# Patient Record
Sex: Male | Born: 1970 | Marital: Married | Smoking: Never smoker
Health system: Southern US, Community
[De-identification: ages and names within clinical notes are randomized; demographics above are authoritative.]

## PROBLEM LIST (undated history)

## (undated) DIAGNOSIS — Z87442 Personal history of urinary calculi: Secondary | ICD-10-CM

## (undated) DIAGNOSIS — G4730 Sleep apnea, unspecified: Secondary | ICD-10-CM

## (undated) DIAGNOSIS — L309 Dermatitis, unspecified: Secondary | ICD-10-CM

## (undated) HISTORY — DX: Personal history of urinary calculi: Z87.442

## (undated) HISTORY — DX: Dermatitis, unspecified: L30.9

## (undated) HISTORY — PX: OTHER SURGICAL HISTORY: SHX169

## (undated) HISTORY — PX: WISDOM TOOTH EXTRACTION: SHX21

---

## 2012-12-26 ENCOUNTER — Ambulatory Visit: Payer: Self-pay | Admitting: Nephrology

## 2014-06-06 ENCOUNTER — Ambulatory Visit (HOSPITAL_BASED_OUTPATIENT_CLINIC_OR_DEPARTMENT_OTHER): Payer: BC Managed Care – PPO | Attending: Internal Medicine

## 2014-06-06 ENCOUNTER — Other Ambulatory Visit: Payer: Self-pay | Admitting: Internal Medicine

## 2014-06-06 DIAGNOSIS — G4733 Obstructive sleep apnea (adult) (pediatric): Secondary | ICD-10-CM | POA: Diagnosis not present

## 2014-06-06 DIAGNOSIS — R0683 Snoring: Secondary | ICD-10-CM | POA: Diagnosis not present

## 2014-06-06 DIAGNOSIS — G4710 Hypersomnia, unspecified: Secondary | ICD-10-CM | POA: Insufficient documentation

## 2014-06-06 DIAGNOSIS — Z9989 Dependence on other enabling machines and devices: Secondary | ICD-10-CM

## 2014-06-06 DIAGNOSIS — Z6829 Body mass index (BMI) 29.0-29.9, adult: Secondary | ICD-10-CM | POA: Insufficient documentation

## 2014-06-14 DIAGNOSIS — G4733 Obstructive sleep apnea (adult) (pediatric): Secondary | ICD-10-CM

## 2014-06-14 NOTE — Sleep Study (Signed)
[facility]  NAME: Peter Price DATE OF BIRTH:  07/06/1971 MEDICAL RECORD NUMBER [id number]  LOCATION: [facility]  PHYSICIAN: Coralyn HellingVineet Natashia Roseman, M.D. DATE OF STUDY: 06/06/2014  SLEEP STUDY TYPE: Split night protocol               REFERRING PHYSICIAN: Stephanie AcreMungal, Vishal, MD  INDICATION FOR STUDY:  Peter Price is a 43 y.o. male who presents to the sleep lab for evaluation of hypersomnia with obstructive sleep apnea.  He reports snoring, sleep disruption, apnea, and daytime sleepiness.  EPWORTH SLEEPINESS SCORE: 4. HEIGHT: 5\' 7"  WEIGHT: 184 lbs   BMI: 29  NECK SIZE: 15 in.  MEDICATIONS:  No current outpatient prescriptions on file prior to visit.   No current facility-administered medications on file prior to visit.    SLEEP ARCHITECTURE:  Diagnostic portion: Total recording time: 145 minutes.  Total sleep time was: 121 minutes.  Sleep efficiency: 83.4%.  Sleep latency: 10 minutes.  REM latency: 107 minutes.  Stage N1: 6.6%.  Stage N2: 81.8%.  Stage N3: 0%.  Stage R:  11.6%.  Supine sleep: 62 minutes.  Non-supine sleep: 59 minutes.  Titration portion: Total recording time: 224 minutes.  Total sleep time was: 174.5 minutes.  Sleep efficiency: 77.9%.  Sleep latency: 0 minutes.  REM latency: 70 minutes.  Stage N1: 6.9%.  Stage N2: 57.6%.  Stage N3: 0%.  Stage R:  35.5%.  Supine sleep: 110.5 minutes.  Non-supine sleep: 64 minutes.  CARDIAC DATA:  Average heart rate: 81 beats per minute. Rhythm strip: sinus rhythm.  RESPIRATORY DATA: Average respiratory rate: 16. Snoring: loud.  Diagnostic portion: Average AHI: 54.   Apnea index: 12.4.  Hypopnea index: 41.7. Obstructive apnea index: 11.9.  Central apnea index: 0.5.  Mixed apnea index: 0. REM AHI: 55.7.  NREM AHI: 53.8. Supine AHI: 75.5. Non-supine AHI: 31.5.  Titration portion: He was started on CPAP 5 and increased to 8 cm H2O.  With CPAP at 6 cm H2O his AHI was reduced to 0.6.  At  this pressure setting he was observed in REM and supine sleep.  MOVEMENT/PARASOMNIA:  Diagnostic portion: Periodic limb movement: 0.  Period limb movements with arousals: 0.  Titration portion: Periodic limb movement: 0.  Period limb movements with arousals: 0.  Restroom trips: one.  OXYGEN DATA:  Baseline oxygenation: 93%. Lowest SaO2: 81%. Time spent below SaO2 90%: 14 minutes. Supplemental oxygen used: none.  IMPRESSION/ RECOMMENDATION:   This study showed severe obstructive sleep apnea with an AHI of 54 and SaO2 low of 81%.  He did very well with CPAP 6 cm H2O.  He was fitted with a small size Fisher Paykel Eson mask.    Coralyn HellingVineet Telesforo Brosnahan, M.D. Diplomate, Biomedical engineerAmerican Board of Sleep Medicine  ELECTRONICALLY SIGNED ON:  06/14/2014, 2:33 PM [facility] PH: [phone]   FX: [phone] ACCREDITED BY THE AMERICAN ACADEMY OF SLEEP MEDICINE

## 2014-06-15 ENCOUNTER — Telehealth: Payer: Self-pay | Admitting: Internal Medicine

## 2014-06-15 DIAGNOSIS — G4733 Obstructive sleep apnea (adult) (pediatric): Secondary | ICD-10-CM

## 2014-06-15 NOTE — Telephone Encounter (Signed)
Per Dr. Dema SeverinMungal, please place order for cpap. [facility] rep [facility] and [facility]. Order printed and signed by Dr. Dema SeverinMungal and left in [facility] office with Sleep Study results with Dr. Courtney ParisMungal's nurse. Nothing further needed at this time.

## 2014-06-19 ENCOUNTER — Encounter: Payer: Self-pay | Admitting: Internal Medicine

## 2014-06-19 ENCOUNTER — Ambulatory Visit (INDEPENDENT_AMBULATORY_CARE_PROVIDER_SITE_OTHER): Payer: BC Managed Care – PPO | Admitting: Internal Medicine

## 2014-06-19 VITALS — BP 140/90 | HR 101 | Temp 97.1°F | Ht 67.0 in | Wt 191.6 lb

## 2014-06-19 DIAGNOSIS — Z91048 Other nonmedicinal substance allergy status: Secondary | ICD-10-CM

## 2014-06-19 DIAGNOSIS — Z9109 Other allergy status, other than to drugs and biological substances: Secondary | ICD-10-CM

## 2014-06-19 DIAGNOSIS — G4733 Obstructive sleep apnea (adult) (pediatric): Secondary | ICD-10-CM

## 2014-06-19 DIAGNOSIS — R05 Cough: Secondary | ICD-10-CM

## 2014-06-19 DIAGNOSIS — R059 Cough, unspecified: Secondary | ICD-10-CM | POA: Insufficient documentation

## 2014-06-19 MED ORDER — ALBUTEROL SULFATE HFA 108 (90 BASE) MCG/ACT IN AERS: 2.0000 | INHALATION_SPRAY | RESPIRATORY_TRACT | Status: DC | PRN

## 2014-06-19 MED ORDER — OMEPRAZOLE 40 MG PO CPDR
40.0000 mg | DELAYED_RELEASE_CAPSULE | Freq: Every day | ORAL | Status: DC
Start: 2014-06-19 — End: 2019-03-17

## 2014-06-19 NOTE — Assessment & Plan Note (Signed)
Severe obstructive sleep apnea.   1. OSA  Discussed sleep data and reviewed with patient.  Encouraged proper weight management.  Excessive weight may contribute to snoring.  Monitor sedative use.  Discussed driving precautions and its relationship with hypersomnolence.  Discussed operating dangerous equipment and its relationship with hypersomnolence.  Discussed sleep hygiene, and benefits of a fixed sleep waked time.  The importance of getting eight or more hours of sleep discussed with patient.  Discussed limiting the use of the computer and television before bedtime.  Decrease naps during the day, so night time sleep will become enhanced.  Limit caffeine, and sleep deprivation.  HTN, stroke, and heart failure are potential risk factors.      Plan: Follow up in  month for C-PAP compliance, unless other concerns, issues, or needs develop, then call our office for an appointment.  - referral to DME for autopap 5-15, will followup on download.

## 2014-06-19 NOTE — Assessment & Plan Note (Signed)
Multifactorial: Possible seasonal allergies, reflux, asthma (possible), OSA.  Plan: -Give a history and current physical examination, it seems to be an environmental component to this cough that could be causing bronchospasms. However, there also could be a component of silent reflux that is also exacerbating the symptoms. -Will give a trial of omeprazole 40 mg daily for one month. -Optimizing OSA treatment is also paramount for any cough/bronchospasms, continue with CPAP. -Further evaluation with pulmonary function testing and 6 minute walk test. -Will also give trial of bronchodilator, albuterol 2 puffs every 4 hours when necessary shortness of breath, wheezing, cough. -if above interventions did not provide the necessary relief will further consider nasal steroid at next visit.

## 2014-06-19 NOTE — Assessment & Plan Note (Signed)
Exacerbated by cold weather and seasonal changes, especially fall to winter and winter to spring. Plan: -As outlined and cough management. -Patient may require an inhaled corticosteroid during season changes only, will continue to monitor closely for now.

## 2014-06-19 NOTE — Patient Instructions (Signed)
We will schedule you for 1 month f/u after breathing test. We will schedule a breathing test. Please check pharmacy for prescriptions

## 2014-06-19 NOTE — Progress Notes (Signed)
Date: 06/19/2014  MRN# [id number] Peter Price 09/13/1970  Referring Physician: self  Peter Price is a 43 y.o. old male seen in consultation for sleep apnea   CC: "sleep apnea" Chief Complaint  Patient presents with  . Advice Only    New Sleep Consult    HPI:  Patient is a pleasant 43 year old male with a past medical history significant only for renal calculi in the left kidney, who self-referred today for a followup of a split sleep study. Patient states that he's had complaints of loud snoring and possible apneic events in the past and decided to have a home sleep study performed. Home sleep study performed on 05/28/2014 that showed significant sleep apnea with AHI of 53.5, lowest saturation 77%. At this point the patient was referred for a split sleep study with [facility] [place]. Patient also stated that he experiences mild cough and shortness of breath with seasonal changes, especially with cold weather; in the past he has tried a sample of Symbicort relief in one to 2 days.   Obstructive Sleep Apnea Screening The patient was screened with the STOP-BANG questionnaire. >3 positive responses is considered a positive screen  Snoring  YES Tiredness  YES Observed Apnea YES Pressure (HTN) NO BMI >35  30 Age > 50  NO Neck >17"  NO Gender (male) YES  Total: 4/8  Screen: POSITIVE     PMHX:   Past Medical History  Diagnosis Date  . H/O renal calculi     Left kidney  . Eczema    Surgical Hx:  Past Surgical History  Procedure Laterality Date  . None     Family Hx:  Family History  Problem Relation Age of Onset  . Asthma Cousin    Social Hx:   History  Substance Use Topics  . Smoking status: Not on file  . Smokeless tobacco: Not on file  . Alcohol Use: Not on file   Medication:   Current Outpatient Rx  Name  Route  Sig  Dispense  Refill  . albuterol (PROVENTIL HFA;VENTOLIN HFA) 108 (90 BASE) MCG/ACT inhaler   Inhalation   Inhale 2 puffs into the  lungs every 2 (two) hours as needed for wheezing or shortness of breath.   1 Inhaler   2   . omeprazole (PRILOSEC) 40 MG capsule   Oral   Take 1 capsule (40 mg total) by mouth daily.   30 capsule   1       Allergies:  Review of patient's allergies indicates no known allergies.  Review of Systems: Gen:  Denies  fever, sweats, chills HEENT: Denies blurred vision, double vision, ear pain, eye pain, hearing loss, nose bleeds, sore throat Cvc:  No dizziness, chest pain or heaviness Resp:   Denies cough or sputum porduction, shortness of breath Gi: Denies swallowing difficulty, stomach pain, nausea or vomiting, diarrhea, constipation, bowel incontinence Gu:  Denies bladder incontinence, burning urine Ext:   No Joint pain, stiffness or swelling Skin: No skin rash, easy bruising or bleeding or hives Endoc:  No polyuria, polydipsia , polyphagia or weight change Psych: No depression, insomnia or hallucinations  Other:  All other systems negative  Physical Examination:   VS: BP 140/90 mmHg  Pulse 101  Temp(Src) 97.1 F (36.2 C) (Oral)  Ht 5\' 7"  (1.702 m)  Wt 191 lb 9.6 oz (86.909 kg)  BMI 30.00 kg/m2  SpO2 96%  General Appearance: No distress  Neuro:without focal findings, mental status, speech normal, alert and oriented, cranial  nerves 2-12 intact, reflexes normal and symmetric, sensation grossly normal  HEENT: PERRLA, EOM intact, no ptosis, no other lesions noticed; Mallampati 2 Pulmonary: normal breath sounds., diaphragmatic excursion normal.No wheezing, No rales;   Sputum Production:  none CardiovascularNormal S1,S2.  No m/r/g.  Abdominal aorta pulsation normal.    Abdomen: Benign, Soft, non-tender, No masses, hepatosplenomegaly, No lymphadenopathy Renal:  No costovertebral tenderness  GU:  No performed at this time. Endoc: No evident thyromegaly, no signs of acromegaly or Cushing features Skin:   warm, eczema patch on anterior right leg  Extremities: normal, no cyanosis,  clubbing, no edema, warm with normal capillary refill. Other findings:none   Split Night Study results 06/06/14 Diagnostic portion: Average AHI: 54.  Apnea index: 12.4. Hypopnea index: 41.7. Obstructive apnea index: 11.9. Central apnea index: 0.5. Mixed apnea index: 0. REM AHI: 55.7. NREM AHI: 53.8. Supine AHI: 75.5. Non-supine AHI: 31.5.  Titration portion: He was started on CPAP 5 and increased to 8 cm H2O. With CPAP at 6 cm H2O his AHI was reduced to 0.6. At this pressure setting he was observed in REM and supine sleep.  Assessment and Plan: OSA (obstructive sleep apnea) Severe obstructive sleep apnea.   1. OSA  Discussed sleep data and reviewed with patient.  Encouraged proper weight management.  Excessive weight may contribute to snoring.  Monitor sedative use.  Discussed driving precautions and its relationship with hypersomnolence.  Discussed operating dangerous equipment and its relationship with hypersomnolence.  Discussed sleep hygiene, and benefits of a fixed sleep waked time.  The importance of getting eight or more hours of sleep discussed with patient.  Discussed limiting the use of the computer and television before bedtime.  Decrease naps during the day, so night time sleep will become enhanced.  Limit caffeine, and sleep deprivation.  HTN, stroke, and heart failure are potential risk factors.      Plan: Follow up in  month for C-PAP compliance, unless other concerns, issues, or needs develop, then call our office for an appointment.  - referral to DME for autopap 5-15, will followup on download.      Cough Multifactorial: Possible seasonal allergies, reflux, asthma (possible), OSA.  Plan: -Give a history and current physical examination, it seems to be an environmental component to this cough that could be causing bronchospasms. However, there also could be a component of silent reflux that is also exacerbating the symptoms. -Will give a trial of  omeprazole 40 mg daily for one month. -Optimizing OSA treatment is also paramount for any cough/bronchospasms, continue with CPAP. -Further evaluation with pulmonary function testing and 6 minute walk test. -Will also give trial of bronchodilator, albuterol 2 puffs every 4 hours when necessary shortness of breath, wheezing, cough. -if above interventions did not provide the necessary relief will further consider nasal steroid at next visit.  Environmental allergies Exacerbated by cold weather and seasonal changes, especially fall to winter and winter to spring. Plan: -As outlined and cough management. -Patient may require an inhaled corticosteroid during season changes only, will continue to monitor closely for now.     Updated Medication List Outpatient Encounter Prescriptions as of 06/19/2014  Medication Sig  . albuterol (PROVENTIL HFA;VENTOLIN HFA) 108 (90 BASE) MCG/ACT inhaler Inhale 2 puffs into the lungs every 2 (two) hours as needed for wheezing or shortness of breath.  Marland Kitchen omeprazole (PRILOSEC) 40 MG capsule Take 1 capsule (40 mg total) by mouth daily.  . [DISCONTINUED] albuterol (PROVENTIL HFA;VENTOLIN HFA) 108 (90 BASE) MCG/ACT inhaler Inhale  2 puffs into the lungs every 2 (two) hours as needed for wheezing or shortness of breath.    Orders for this visit: No orders of the defined types were placed in this encounter.     Thank  you for the consultation and for allowing [facility] to assist in the care of your patient. Our recommendations are noted above.  Please contact us if we can be of further service.   Stephanie AcreVishal Brayln Duque, MD [facility] Office Number: [phone]

## 2014-06-20 ENCOUNTER — Institutional Professional Consult (permissible substitution): Payer: BC Managed Care – PPO | Admitting: Internal Medicine

## 2014-06-21 ENCOUNTER — Institutional Professional Consult (permissible substitution): Payer: Self-pay | Admitting: Internal Medicine

## 2014-07-02 ENCOUNTER — Telehealth: Payer: Self-pay | Admitting: Internal Medicine

## 2014-07-02 ENCOUNTER — Encounter: Payer: Self-pay | Admitting: *Deleted

## 2014-07-02 NOTE — Telephone Encounter (Signed)
Called and left message that letter for cpap therapy has been written and will be available for pick-up today.

## 2014-07-30 ENCOUNTER — Other Ambulatory Visit: Payer: Self-pay | Admitting: Internal Medicine

## 2014-07-30 DIAGNOSIS — R059 Cough, unspecified: Secondary | ICD-10-CM

## 2014-07-30 DIAGNOSIS — R05 Cough: Secondary | ICD-10-CM

## 2014-08-07 ENCOUNTER — Ambulatory Visit (INDEPENDENT_AMBULATORY_CARE_PROVIDER_SITE_OTHER): Payer: BLUE CROSS/BLUE SHIELD | Admitting: Internal Medicine

## 2014-08-07 ENCOUNTER — Encounter: Payer: Self-pay | Admitting: Internal Medicine

## 2014-08-07 VITALS — BP 122/84 | HR 86 | Temp 98.1°F | Wt 193.6 lb

## 2014-08-07 DIAGNOSIS — R05 Cough: Secondary | ICD-10-CM

## 2014-08-07 DIAGNOSIS — J4520 Mild intermittent asthma, uncomplicated: Secondary | ICD-10-CM | POA: Diagnosis not present

## 2014-08-07 DIAGNOSIS — Z91048 Other nonmedicinal substance allergy status: Secondary | ICD-10-CM | POA: Diagnosis not present

## 2014-08-07 DIAGNOSIS — J45909 Unspecified asthma, uncomplicated: Secondary | ICD-10-CM | POA: Insufficient documentation

## 2014-08-07 DIAGNOSIS — G4733 Obstructive sleep apnea (adult) (pediatric): Secondary | ICD-10-CM | POA: Diagnosis not present

## 2014-08-07 DIAGNOSIS — R059 Cough, unspecified: Secondary | ICD-10-CM

## 2014-08-07 DIAGNOSIS — Z9109 Other allergy status, other than to drugs and biological substances: Secondary | ICD-10-CM

## 2014-08-07 LAB — PULMONARY FUNCTION TEST
DL/VA % pred: 123 %
DL/VA: 5.52 ml/min/mmHg/L
DLCO UNC: 26.28 ml/min/mmHg
DLCO unc % pred: 92 %
FEF 25-75 POST: 3.95 L/s
FEF 25-75 Pre: 4.25 L/sec
FEF2575-%CHANGE-POST: -7 %
FEF2575-%PRED-POST: 110 %
FEF2575-%PRED-PRE: 119 %
FEV1-%CHANGE-POST: -1 %
FEV1-%Pred-Post: 82 %
FEV1-%Pred-Pre: 83 %
FEV1-Post: 3.09 L
FEV1-Pre: 3.13 L
FEV1FVC-%Change-Post: 3 %
FEV1FVC-%PRED-PRE: 108 %
FEV6-%Change-Post: -4 %
FEV6-%PRED-POST: 76 %
FEV6-%Pred-Pre: 79 %
FEV6-PRE: 3.67 L
FEV6-Post: 3.51 L
FEV6FVC-%PRED-PRE: 103 %
FEV6FVC-%Pred-Post: 103 %
FVC-%Change-Post: -4 %
FVC-%PRED-POST: 74 %
FVC-%Pred-Pre: 77 %
FVC-PRE: 3.67 L
FVC-Post: 3.51 L
Post FEV1/FVC ratio: 88 %
Post FEV6/FVC ratio: 100 %
Pre FEV1/FVC ratio: 85 %
Pre FEV6/FVC Ratio: 100 %
RV % pred: 71 %
RV: 1.22 L
TLC % pred: 78 %
TLC: 4.94 L

## 2014-08-07 MED ORDER — FLUTICASONE FUROATE 200 MCG/ACT IN AEPB: 200.0000 ug | INHALATION_SPRAY | Freq: Every day | RESPIRATORY_TRACT | Status: DC

## 2014-08-07 NOTE — Progress Notes (Signed)
PFT performed today. 

## 2014-08-07 NOTE — Assessment & Plan Note (Signed)
Severe obstructive sleep apnea.   1. OSA  Discussed sleep data and reviewed with patient.  Encouraged proper weight management.  Excessive weight may contribute to snoring.  Monitor sedative use.  Discussed driving precautions and its relationship with hypersomnolence.  Discussed operating dangerous equipment and its relationship with hypersomnolence.  Discussed sleep hygiene, and benefits of a fixed sleep waked time.  The importance of getting eight or more hours of sleep discussed with patient.  Discussed limiting the use of the computer and television before bedtime.  Decrease naps during the day, so night time sleep will become enhanced.  Limit caffeine, and sleep deprivation.  HTN, stroke, and heart failure are potential risk factors.      Plan: Follow up in  4-5 months for C-PAP compliance, unless other concerns, issues, or needs develop, then call our office for an appointment.  - autopap 5-15, will followup on download.

## 2014-08-07 NOTE — Assessment & Plan Note (Signed)
Patient now with improvement in cough\wheeze\ and sob with the use of cpap. However, further history reveals the following: - sob\wheezig with weather changes (Spring, Fall to winter, and pollen\dust)  I believe that the patient has environmental\extrinsic causes for his symptoms. We will plan for the following:  Plan - allergy control during weather\season (OTC antihistamine) along with Qvar 40mcg or Arnuity 100mcg (1 puff daily) - cont with cpap use - avoid allergens

## 2014-08-07 NOTE — Assessment & Plan Note (Signed)
Exacerbated by cold weather and seasonal changes, especially fall to winter and winter to spring. Plan: - see plan for extrinsic asthma

## 2014-08-07 NOTE — Patient Instructions (Addendum)
Follow up with Dr. Dema SeverinMungal in 4-5 months  Obstructive Sleep Apnea - use cpap night for minimum 4 hours per night - weight loss, healthy diet and exercise - please bring a copy of your compliance report from DME or have DME fax a copy to [facility] prior to your next visit.   Extrinsic Asthma - due to allergens, mostly a seasonal symptomology - avoid allergens - may use OTC antihistamines when seasons are about to change or with rapid weather changes - may use ICS (Qvar or Arnuity) starting 2 weeks before season changes and for 1 month after season changes (call office for rx if needed).

## 2014-08-07 NOTE — Progress Notes (Signed)
MRN# [id number] Peter Price 12-19-1970   CC: Chief Complaint  Patient presents with  . Follow-up    follow up pft      Brief History: HPI 06/19/14 Patient is a pleasant 44 year old male with a past medical history significant only for renal calculi in the left kidney, who self-referred today for a followup of a split sleep study. Patient states that he's had complaints of loud snoring and possible apneic events in the past and decided to have a home sleep study performed. Home sleep study performed on 05/28/2014 that showed significant sleep apnea with AHI of 53.5, lowest saturation 77%. At this point the patient was referred for a split sleep study with [facility] [place]. Patient also stated that he experiences mild cough and shortness of breath with seasonal changes, especially with cold weather; in the past he has tried a sample of Symbicort relief in one to 2 days.  A:severe sleep apnea, ? Extrinsic asthma Plan - autopap 5-15, compliance education, pfts and 6 mwt    Events since last clinic visit: Patient present today for a follow up of his OSA, along with discussion of his pfts and .  Patient states that since using his cpap machine he no longer has a cough, no wheezing or acid reflux type symptoms. Patient noted sob\wheezing mostly with change in season (Winter, Spring) and with allergens (pollen and dust); in the past he tried a sample symbicort with moderate relief of his symptoms. Today he states that he is doing well from a respiratory standpoint.    PMHX:   Past Medical History  Diagnosis Date  . H/O renal calculi     Left kidney  . Eczema    Surgical Hx:  Past Surgical History  Procedure Laterality Date  . None     Family Hx:  Family History  Problem Relation Age of Onset  . Asthma Cousin    Social Hx:   History  Substance Use Topics  . Smoking status: Never Smoker   . Smokeless tobacco: Never Used  . Alcohol Use: 0.0 oz/week    0 Not  specified per week     Comment: social   Medication:   Current Outpatient Rx  Name  Route  Sig  Dispense  Refill  . albuterol (PROVENTIL HFA;VENTOLIN HFA) 108 (90 BASE) MCG/ACT inhaler   Inhalation   Inhale 2 puffs into the lungs every 2 (two) hours as needed for wheezing or shortness of breath.   1 Inhaler   2   . omeprazole (PRILOSEC) 40 MG capsule   Oral   Take 1 capsule (40 mg total) by mouth daily.   30 capsule   1      Review of Systems: Gen:  Denies  fever, sweats, chills HEENT: Denies blurred vision, double vision, ear pain, eye pain, hearing loss, nose bleeds, sore throat Cvc:  No dizziness, chest pain or heaviness Resp:   Denies cough or sputum porduction, shortness of breath Gi: Denies swallowing difficulty, stomach pain, nausea or vomiting, diarrhea, constipation, bowel incontinence Gu:  Denies bladder incontinence, burning urine Ext:   No Joint pain, stiffness or swelling Skin: No skin rash, easy bruising or bleeding or hives Endoc:  No polyuria, polydipsia , polyphagia or weight change Psych: No depression, insomnia or hallucinations  Other:  All other systems negative  Allergies:  Review of patient's allergies indicates no known allergies.  Physical Examination:  VS: BP 122/84 mmHg  Pulse 86  Temp(Src) 98.1 F (36.7 C) (Oral)  Wt 193 lb 9.6 oz (87.816 kg)  SpO2 95%  General Appearance: No distress  Neuro: EXAM: without focal findings, mental status, speech normal, alert and oriented, cranial nerves 2-12 grossly normal  HEENT: PERRLA, EOM intact, no ptosis, no other lesions noticed Pulmonary:Exam: normal breath sounds., diaphragmatic excursion normal.No wheezing, No rales   Cardiovascular:@ Exam:  Normal S1,S2.  No m/r/g.     Abdomen:Exam: Benign, Soft, non-tender, No masses  Skin:   warm, no rashes, no ecchymosis  Extremities: normal, no cyanosis, clubbing, no edema, warm with normal capillary refill.   Labs results:  BMP No results found for: NA,  K, CL, CO2, GLUCOSE, BUN, CREATININE   CBC No flowsheet data found.  08/07/14 PFT at this time by ATS criteria, is evident for:  No obstructive or restrictive process   Pre-Bronch       Post-Bronch     FEV1: Actual 3.13    Predicted 83%  Actual 3.09 Pred 82%  -1%Chg  FEV1/FVC: Actual 85    Predicted 108%  Actual 88 Pred 111%  3%Chg  RV: Actual 1.22    Predicted 71%     TLC: Actual 4.94    Predicted 78%     RV/TLC (Pleth)(%): Actual 25  Predicted 91%   ERV: Actual 0.39    Predicted 25%  DLCO2:cor: Actual 26.28   Predicted 92%    Assessment and Plan: Extrinsic asthma Patient now with improvement in cough\wheeze\ and sob with the use of cpap. However, further history reveals the following: - sob\wheezig with weather changes (Spring, Fall to winter, and pollen\dust)  I believe that the patient has environmental\extrinsic causes for his symptoms. We will plan for the following:  Plan - allergy control during weather\season (OTC antihistamine) along with Qvar or Arnuity (1 puff daily) - cont with cpap use - avoid allergens   OSA (obstructive sleep apnea) Severe obstructive sleep apnea.   1. OSA  Discussed sleep data and reviewed with patient.  Encouraged proper weight management.  Excessive weight may contribute to snoring.  Monitor sedative use.  Discussed driving precautions and its relationship with hypersomnolence.  Discussed operating dangerous equipment and its relationship with hypersomnolence.  Discussed sleep hygiene, and benefits of a fixed sleep waked time.  The importance of getting eight or more hours of sleep discussed with patient.  Discussed limiting the use of the computer and television before bedtime.  Decrease naps during the day, so night time sleep will become enhanced.  Limit caffeine, and sleep deprivation.  HTN, stroke, and heart failure are potential risk factors.      Plan: Follow up in  4-5 months for C-PAP compliance, unless  other concerns, issues, or needs develop, then call our office for an appointment.  - autopap 5-15, will followup on download.         Environmental allergies Exacerbated by cold weather and seasonal changes, especially fall to winter and winter to spring. Plan: - see plan for extrinsic asthma       Updated Medication List Outpatient Encounter Prescriptions as of 08/07/2014  Medication Sig  . albuterol (PROVENTIL HFA;VENTOLIN HFA) 108 (90 BASE) MCG/ACT inhaler Inhale 2 puffs into the lungs every 2 (two) hours as needed for wheezing or shortness of breath.  Marland Kitchen omeprazole (PRILOSEC) 40 MG capsule Take 1 capsule (40 mg total) by mouth daily.  . Fluticasone Furoate (ARNUITY ELLIPTA) 200 MCG/ACT AEPB Inhale 200 mcg into the lungs daily.    Orders for this visit: No orders of  the defined types were placed in this encounter.    Thank  you for the visitation and for allowing  [facility] to assist in the care of your patient. Our recommendations are noted above.  Please contact us if we can be of further service.  Stephanie AcreVishal Lashawn Bromwell, MD [facility] Office Number: [phone]

## 2014-08-17 ENCOUNTER — Encounter: Payer: Self-pay | Admitting: Internal Medicine

## 2014-09-13 IMAGING — CT CT STONE STUDY
1 of 2 series · 15 of 32 positions shown, 19 images · non-contrast
Comparison: none

REASON FOR EXAM: left flank pain
COMMENTS:

[Series 2: abd 3mm wo 3.0 i40f 3 · axial · 0.91mm/px · z∈[-1048,-613]mm · 15 of 159 slices shown, 19 images]
[im 7/159  soft-tissue]
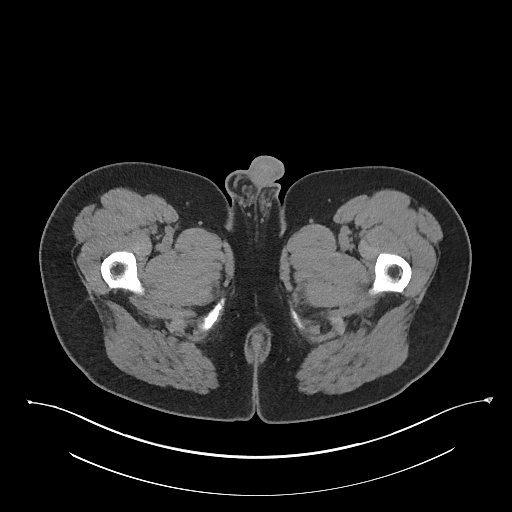
[im 7/159  bone]
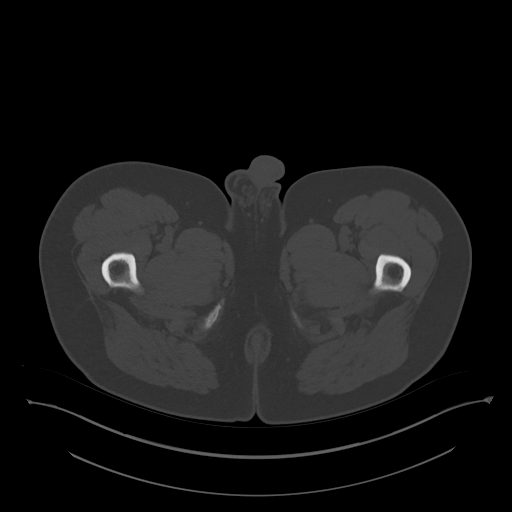
[im 19/159  soft-tissue]
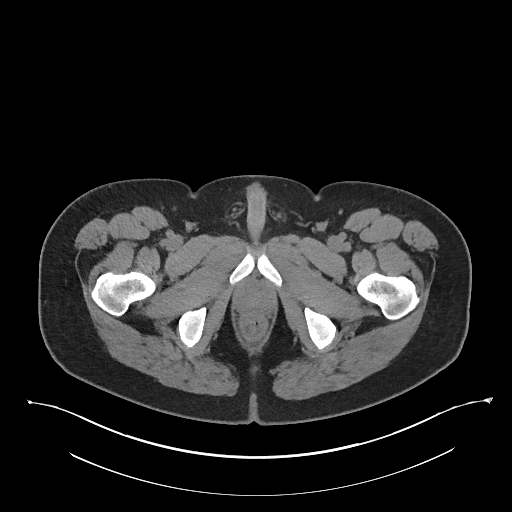
[im 32/159  soft-tissue]
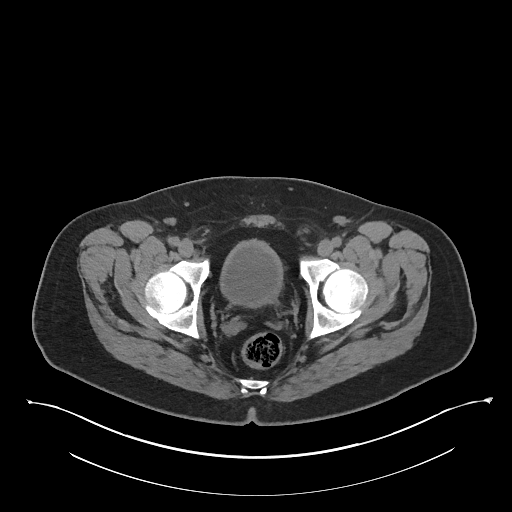
[im 45/159  soft-tissue]
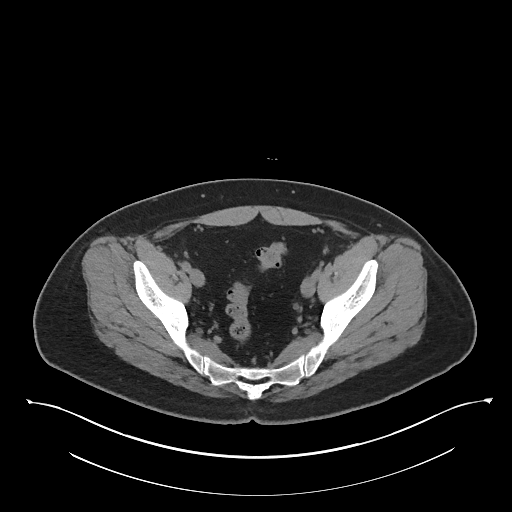
[im 57/159  soft-tissue]
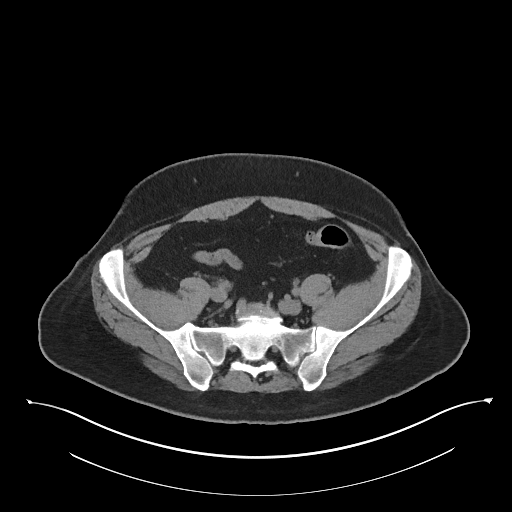
[im 70/159  soft-tissue]
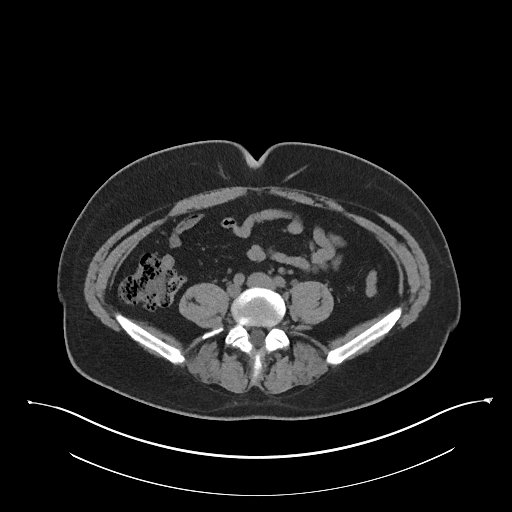
[im 83/159  soft-tissue]
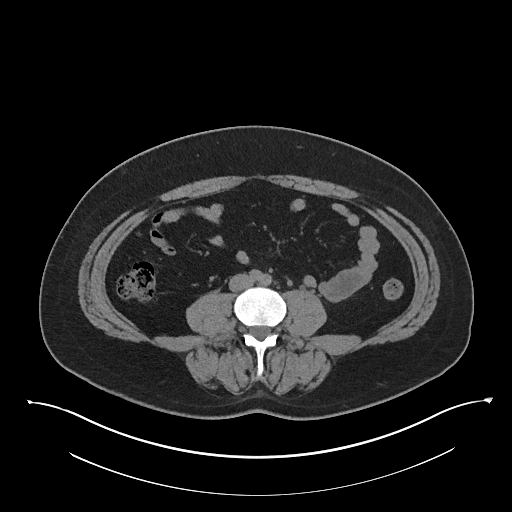
[im 89/159  soft-tissue]
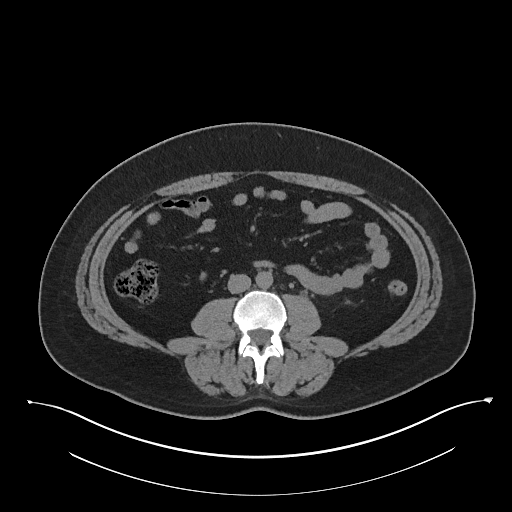
[im 102/159  soft-tissue]
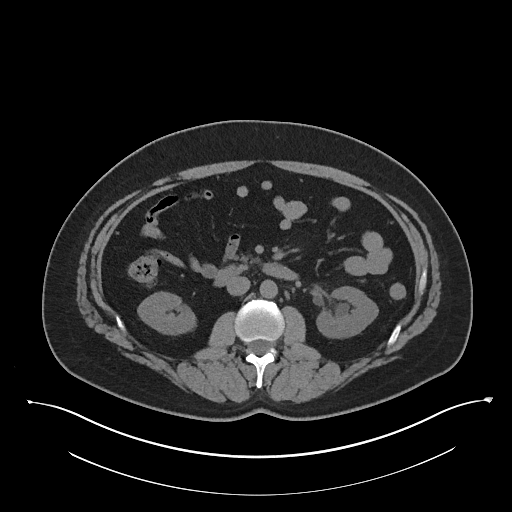
[im 102/159  bone]
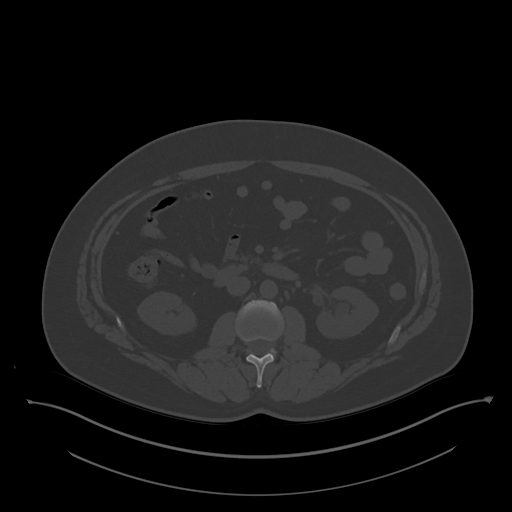
[im 114/159  soft-tissue]
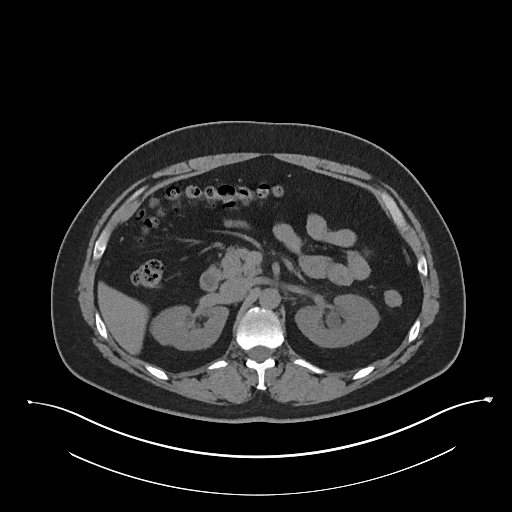
[im 127/159  soft-tissue]
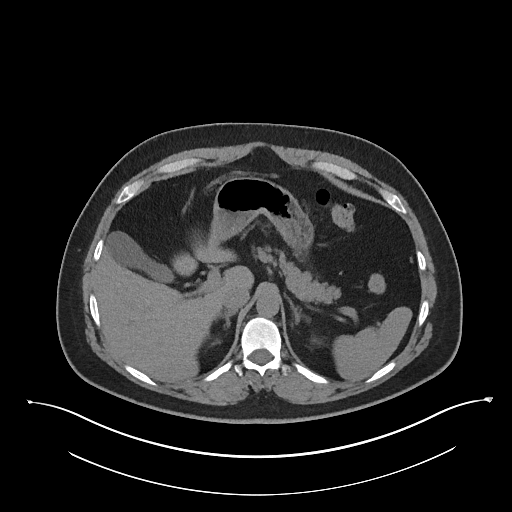
[im 133/159  lung]
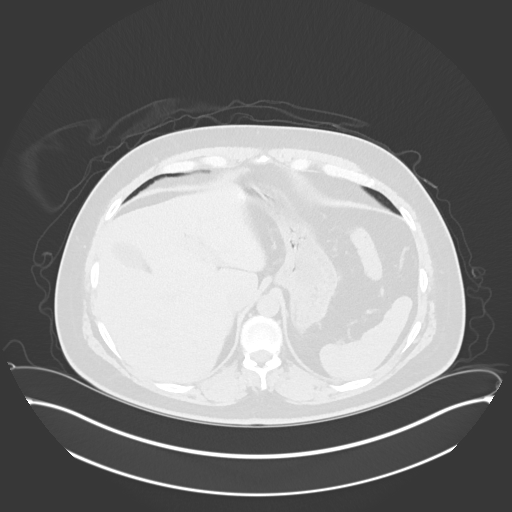
[im 140/159  soft-tissue]
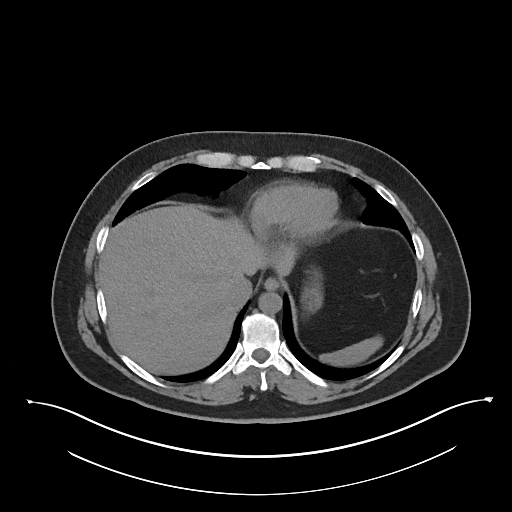
[im 140/159  lung]
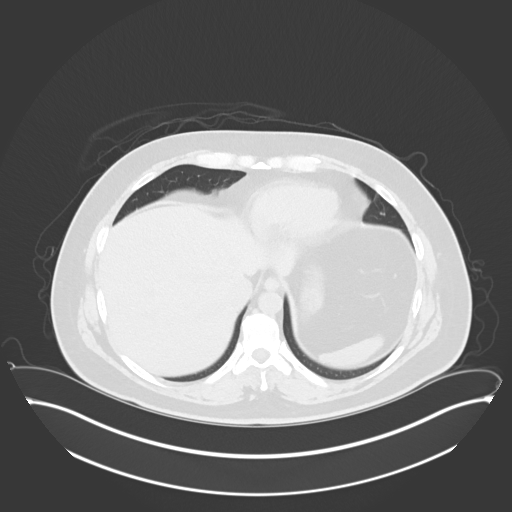
[im 146/159  lung]
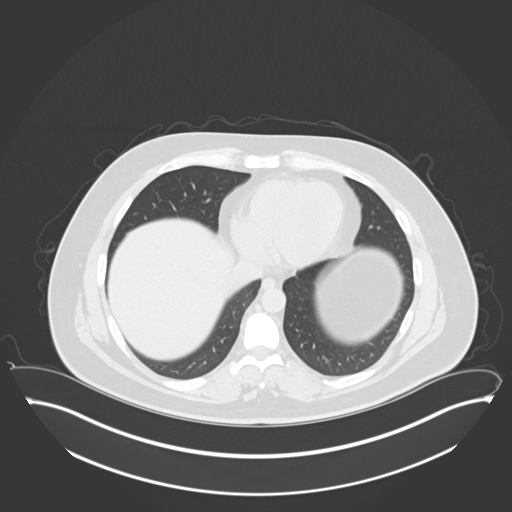
[im 152/159  soft-tissue]
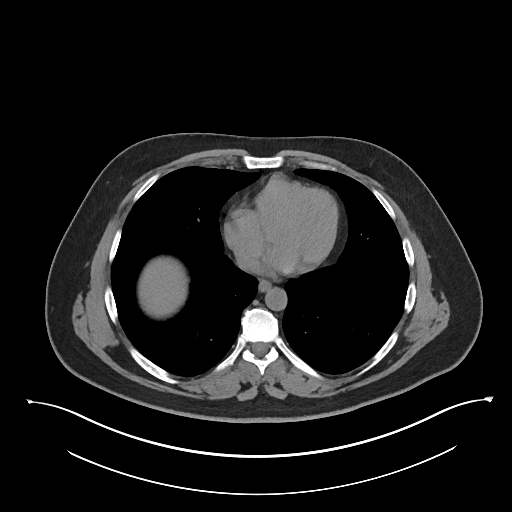
[im 152/159  lung]
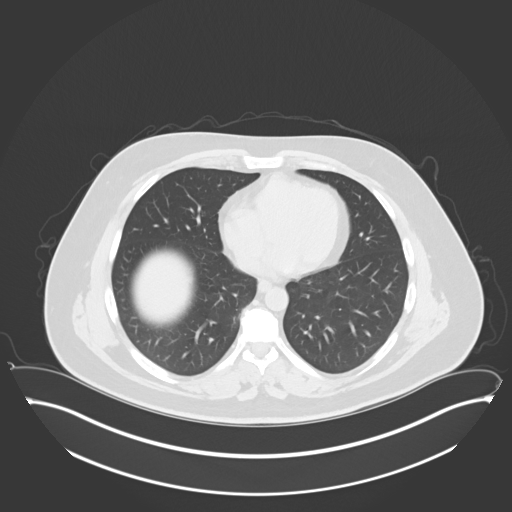

[15 of 32 positions shown; findings below may reference images not displayed]

PROCEDURE:     KCT - KCT ABDOMEN/PELVIS WO (STONE)  - December 26, 2012  [DATE]

RESULT:     Renal stone protocol noncontrast CT of the abdomen and pelvis is
reconstructed at 3 mm slice thickness in the axial plane. The patient has no
previous exam for comparison.

There is moderate left hydronephrosis and hydroureter secondary to a 4.8 mm
stone protruding from the left ureteral orifice into the bladder lumen. The
stone has not dropped away from the orifice at this time but is close to
being completely passed.

There is some scattered minimal colonic diverticulosis. The appendix appears
normal. The abdominal aorta shows no calcified atherosclerotic plaque in
itself or in its branches. The lung bases are clear. There is no pleural or
pericardial effusion or evidence of focal pulmonary mass. The noncontrast
images of the gallbladder, liver, spleen, kidneys and adrenal glands are
otherwise unremarkable. The pancreas shows no mass or surrounding
inflammation. The stomach, small bowel and colon appear unremarkable. There
is no evidence of adenopathy. The bony structures appear intact. There is
grade 2 anterolisthesis of L5 on S1. No definite pars defect is identified.
There is some protrusion of disc material and some calcification. There is
obliteration of the disc level at L5-S1. The midline anterior-posterior
dimension of the thecal sac space in the central spinal canal is 10 mm.
IMPRESSION: 1. 5 mm stone at the orifice of the left ureter at the bladder with moderate
left hydronephrosis and hydroureter. No additional calculi are evident.
2. Minimal scattered colonic diverticular disease without evidence of acute
diverticulitis.

[REDACTED]

## 2016-09-16 ENCOUNTER — Telehealth: Payer: Self-pay | Admitting: *Deleted

## 2016-09-16 NOTE — Telephone Encounter (Signed)
Erroneous encounter

## 2019-03-16 ENCOUNTER — Encounter: Payer: Self-pay | Admitting: Internal Medicine

## 2019-03-17 ENCOUNTER — Encounter: Payer: Self-pay | Admitting: Internal Medicine

## 2019-03-17 ENCOUNTER — Ambulatory Visit (INDEPENDENT_AMBULATORY_CARE_PROVIDER_SITE_OTHER): Payer: BC Managed Care – PPO | Admitting: Internal Medicine

## 2019-03-17 ENCOUNTER — Other Ambulatory Visit: Payer: Self-pay

## 2019-03-17 VITALS — BP 126/80 | HR 91 | Temp 98.1°F | Ht 67.0 in | Wt 185.4 lb

## 2019-03-17 DIAGNOSIS — G4733 Obstructive sleep apnea (adult) (pediatric): Secondary | ICD-10-CM | POA: Diagnosis not present

## 2019-03-17 NOTE — Patient Instructions (Signed)
Will adjust auto-CPAP pressure range to 7-15, and Discontinue ramp.  Use cpap every night for the whole night.  Will renew cpap supplies.   Wash supplies every week.

## 2019-03-17 NOTE — Progress Notes (Signed)
Assessment and Plan:  Severe obstructive sleep apnea. - AHI 56, very severe in the supine position with AHI of 75. - Patient has occasional sleep inertia, will adjust auto CPAP pressure to pressure range 7-15.  Will remove pressure ramp. - Discussed importance of continued use of CPAP, and maintaining cleanliness of supplies.  Will order new supplies.  Orders Placed This Encounter  Procedures  . AMB REFERRAL FOR DME   Return in about 1 year (around 03/16/2020).   MRN# [id number] Peter Price 11/25/1970   CC: Chief Complaint  Patient presents with  . Consult    fromer Dr. Stevenson Clinch pt- wearing cpap avg 7-8hr nightly- feels pressure and mask are okay. DME: [facility]      Brief History: Peter Price is a 48 y.o. male with a history of obstructive sleep apnea.  Last seen here by Dr. Stevenson Clinch, at that time he was noted to have sleep apnea, restarted on CPAP supplies.  Today he comes and notes that he has been doing well with CPAP, using it every night, though he notes that he does not have quite the same level of alertness when waking in the morning as he used to. When he saw Dr. Stevenson Clinch he was having coughing at night, he was started empirically on inhaler and prilosec for symptoms of possible asthma and GERD.  However once he was started on CPAP he noted that his respiratory symptoms completely resolved, and he has had no further nocturnal cough since that time.  Split-night sleep study 06/06/2014>> diagnostic portion of the study showed very severe obstructive sleep apnea with AHI of 55.7, and a high of 75.5 in the supine position.  Supine REM was achieved at a pressure of 6. CPAP download data 02/15/2019-03/16/2019>> raw data personally reviewed, usage greater than 4 hours 27/30 days.  Average usage days used is 7 hours 6 minutes pressure ranges 5-15.  Median pressure 6, 95 percentile pressure 8, maximum pressure 9.1.  Leaks are within normal limits.  Residual AHI is 8.1.  Overall this shows very good  compliance with excellent control obstructive sleep apnea.   Previous HPI from 06/19/14 Patient is a pleasant 48 year old male with a past medical history significant only for renal calculi in the left kidney, who self-referred today for a followup of a split sleep study. Patient states that he's had complaints of loud snoring and possible apneic events in the past and decided to have a home sleep study performed. Home sleep study performed on 05/28/2014 that showed significant sleep apnea with AHI of 53.5, lowest saturation 77%. At this point the patient was referred for a split sleep study with [facility] [place]. Patient also stated that he experiences mild cough and shortness of breath with seasonal changes, especially with cold weather; in the past he has tried a sample of Symbicort relief in one to 2 days.  A:severe sleep apnea, ? Extrinsic asthma Plan - autopap 5-15, compliance education, pfts and 6 mwt    Events since last clinic visit: Patient present today for a follow up of his OSA, along with discussion of his pfts and 84mwt.  Patient states that since using his cpap machine he no longer has a cough, no wheezing or acid reflux type symptoms. Patient noted sob\wheezing mostly with change in season (Winter, Spring) and with allergens (pollen and dust); in the past he tried a sample symbicort with moderate relief of his symptoms. Today he states that he is doing well from a respiratory standpoint.  PMHX:   Past Medical History:  Diagnosis Date  . Eczema   . H/O renal calculi    Left kidney   Surgical Hx:   Family Hx:  Family History  Problem Relation Age of Onset  . Asthma Cousin    Social Hx:   Social History   Tobacco Use  . Smoking status: Never Smoker  . Smokeless tobacco: Never Used  Substance Use Topics  . Alcohol use: Yes    Alcohol/week: 0.0 standard drinks    Comment: social  . Drug use: No   Medication:   Current Outpatient Rx  . Order #:  [id number] Class: Normal  . Order #: [id number] Class: Sample  . Order #: [id number] Class: Normal      Allergies:  Patient has no known allergies.  Labs results:  BMP No results found for: NA, K, CL, CO2, GLUCOSE, BUN, CREATININE   CBC No flowsheet data found.  08/07/14 PFT at this time by ATS criteria, is evident for:  No obstructive or restrictive process   Pre-Bronch       Post-Bronch     FEV1: Actual 3.13    Predicted 83%  Actual 3.09 Pred 82%  -1%Chg  FEV1/FVC: Actual 85    Predicted 108%  Actual 88 Pred 111%  3%Chg  RV: Actual 1.22    Predicted 71%     TLC: Actual 4.94    Predicted 78%     RV/TLC (Pleth)(%): Actual 25  Predicted 91%   ERV: Actual 0.39    Predicted 25%  DLCO2:cor: Actual 26.28   Predicted 92%     Thank  you for the visitation and for allowing  [facility] to assist in the care of your patient. Our recommendations are noted above.  Please contact us if we can be of further service.  Wells Guileseep Ziyon Cedotal, M.D., F.C.C.P.  Board Certified in Internal Medicine, Pulmonary Medicine, Critical Care Medicine, and Sleep Medicine.  [facility] Office Number: [phone]

## 2021-04-25 ENCOUNTER — Other Ambulatory Visit: Payer: Self-pay

## 2021-04-25 ENCOUNTER — Ambulatory Visit: Payer: BC Managed Care – PPO | Attending: Internal Medicine

## 2021-04-25 DIAGNOSIS — Z23 Encounter for immunization: Secondary | ICD-10-CM

## 2021-04-25 MED ORDER — MODERNA COVID-19 BIVAL BOOSTER 50 MCG/0.5ML IM SUSP
INTRAMUSCULAR | 0 refills | Status: AC
  Filled 2021-04-25: qty 0.5, 1d supply, fill #0

## 2021-04-25 NOTE — Progress Notes (Signed)
   Covid-19 Vaccination Clinic  Name:  Peter Price    MRN: [id number] DOB: [birth year]  04/25/2021  Peter Price was not observed post Covid-19 immunization for 15 minutes without incident. 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 He was provided with Vaccine Information Sheet and instruction to access the V-Safe system.   Peter Price was instructed to call 911 with any severe reactions post vaccine: Difficulty breathing  Swelling of face and throat  A fast heartbeat  A bad rash all over body  Dizziness and weakness   Drusilla Kanner, PharmD, MBA Clinical Acute Care Pharmacist

## 2022-09-16 ENCOUNTER — Other Ambulatory Visit: Payer: Self-pay

## 2022-09-16 DIAGNOSIS — Z1211 Encounter for screening for malignant neoplasm of colon: Secondary | ICD-10-CM

## 2022-09-16 MED ORDER — PEG 3350-KCL-NA BICARB-NACL 420 G PO SOLR: ORAL | 0 refills | Status: AC

## 2022-10-23 ENCOUNTER — Encounter: Payer: Self-pay | Admitting: Gastroenterology

## 2022-10-29 ENCOUNTER — Encounter: Payer: Self-pay | Admitting: Gastroenterology

## 2022-10-30 ENCOUNTER — Ambulatory Visit: Payer: BC Managed Care – PPO | Admitting: Anesthesiology

## 2022-10-30 ENCOUNTER — Encounter
Admission: RE | Disposition: A | Discharge status: Home / Self Care | Payer: Self-pay | Source: Ambulatory Visit | Attending: Gastroenterology

## 2022-10-30 ENCOUNTER — Ambulatory Visit
Admission: RE | Admit: 2022-10-30 | Discharge: 2022-10-30 | Disposition: A | Discharge status: Home / Self Care | Payer: BC Managed Care – PPO | Source: Ambulatory Visit | Attending: Gastroenterology | Admitting: Gastroenterology

## 2022-10-30 ENCOUNTER — Encounter: Payer: Self-pay | Admitting: Gastroenterology

## 2022-10-30 DIAGNOSIS — G4730 Sleep apnea, unspecified: Secondary | ICD-10-CM | POA: Insufficient documentation

## 2022-10-30 DIAGNOSIS — Z1211 Encounter for screening for malignant neoplasm of colon: Secondary | ICD-10-CM | POA: Diagnosis not present

## 2022-10-30 DIAGNOSIS — K640 First degree hemorrhoids: Secondary | ICD-10-CM | POA: Insufficient documentation

## 2022-10-30 DIAGNOSIS — D122 Benign neoplasm of ascending colon: Secondary | ICD-10-CM | POA: Diagnosis not present

## 2022-10-30 DIAGNOSIS — D126 Benign neoplasm of colon, unspecified: Secondary | ICD-10-CM | POA: Diagnosis not present

## 2022-10-30 DIAGNOSIS — Z87442 Personal history of urinary calculi: Secondary | ICD-10-CM | POA: Insufficient documentation

## 2022-10-30 HISTORY — DX: Sleep apnea, unspecified: G47.30

## 2022-10-30 HISTORY — PX: COLONOSCOPY WITH PROPOFOL: SHX5780

## 2022-10-30 HISTORY — DX: Personal history of urinary calculi: Z87.442

## 2022-10-30 SURGERY — COLONOSCOPY WITH PROPOFOL
Anesthesia: General

## 2022-10-30 MED ORDER — PROPOFOL 10 MG/ML IV BOLUS
INTRAVENOUS | Status: DC | PRN
  Administered 2022-10-30: 80 mg via INTRAVENOUS

## 2022-10-30 MED ORDER — PROPOFOL 1000 MG/100ML IV EMUL
INTRAVENOUS | Status: AC
  Filled 2022-10-30: qty 100

## 2022-10-30 MED ORDER — PROPOFOL 500 MG/50ML IV EMUL
INTRAVENOUS | Status: DC | PRN
  Administered 2022-10-30: 150 ug/kg/min via INTRAVENOUS

## 2022-10-30 MED ORDER — LIDOCAINE HCL (PF) 2 % IJ SOLN
INTRAMUSCULAR | Status: AC
  Filled 2022-10-30: qty 5

## 2022-10-30 MED ORDER — SODIUM CHLORIDE 0.9 % IV SOLN: INTRAVENOUS | Status: DC

## 2022-10-30 MED ORDER — LIDOCAINE HCL (CARDIAC) PF 100 MG/5ML IV SOSY
PREFILLED_SYRINGE | INTRAVENOUS | Status: DC | PRN
  Administered 2022-10-30: 50 mg via INTRAVENOUS

## 2022-10-30 NOTE — H&P (Signed)
Peter Mood, MD [address], [address], [place], [place], [address] [address], [place], [place], [address] Phone: [phone]  Fax: [phone]  Primary Care Physician:  Patient, No Pcp Per   Pre-Procedure History & Physical: HPI:  Peter Price is a 52 y.o. male is here for an colonoscopy.   Past Medical History:  Diagnosis Date   Eczema    H/O renal calculi    Left kidney   History of kidney stones    Sleep apnea     Past Surgical History:  Procedure Laterality Date   none     WISDOM TOOTH EXTRACTION      Prior to Admission medications   Medication Sig Start Date End Date Taking? Authorizing Provider  COVID-19 mRNA bivalent vaccine, Moderna, (MODERNA COVID-19 BIVAL BOOSTER) 50 MCG/0.5ML injection Inject into the muscle. 04/25/21   Peter Munson, MD  polyethylene glycol-electrolytes (NULYTELY) 420 g solution Starting at 5:00 PM: Drink one 8 oz glass of mixture every 15 minutes until you finish half of the jug. Five hours prior to procedure, drink 8 oz glass of mixture every 15 minutes until it is all gone. Make sure you do not drink anything 4 hours prior to your procedure. 09/16/22   Peter Mood, MD    Allergies as of 09/16/2022   (No Known Allergies)    Family History  Problem Relation Age of Onset   Asthma Cousin     Social History   Socioeconomic History   Marital status: Married    Spouse name: Not on file   Number of children: Not on file   Years of education: Not on file   Highest education level: Not on file  Occupational History   Occupation: nephrologist  Tobacco Use   Smoking status: Never   Smokeless tobacco: Never  Vaping Use   Vaping Use: Never used  Substance and Sexual Activity   Alcohol use: Yes    Alcohol/week: 0.0 standard drinks of alcohol    Comment: social   Drug use: No   Sexual activity: Not on file  Other Topics Concern   Not on file  Social History Narrative   Not on file   Social Determinants of  Health   Financial Resource Strain: Not on file  Food Insecurity: Not on file  Transportation Needs: Not on file  Physical Activity: Not on file  Stress: Not on file  Social Connections: Not on file  Intimate Partner Violence: Not on file    Review of Systems: See HPI, otherwise negative ROS  Physical Exam: BP 119/87   Pulse 77   Temp (!) 96.6 F (35.9 C)   Resp 16   Ht  (1.702 m)   Wt 84.8 kg   SpO2 99%   BMI 29.29 kg/m  General:   Alert,  pleasant and cooperative in NAD Head:  Normocephalic and atraumatic. Neck:  Supple; no masses or thyromegaly. Lungs:  Clear throughout to auscultation, normal respiratory effort.    Heart:  +S1, +S2, Regular rate and rhythm, No edema. Abdomen:  Soft, nontender and nondistended. Normal bowel sounds, without guarding, and without rebound.   Neurologic:  Alert and  oriented x4;  grossly normal neurologically.  Impression/Plan: Peter Price is here for an colonoscopy to be performed for Screening colonoscopy average risk   Risks, benefits, limitations, and alternatives regarding  colonoscopy have been reviewed with the patient.  Questions have been answered.  All parties agreeable.   Peter Mood, MD  10/30/2022, 7:40 AM

## 2022-10-30 NOTE — Anesthesia Preprocedure Evaluation (Addendum)
Anesthesia Evaluation  Patient identified by MRN, date of birth, ID band Patient awake    Reviewed: Allergy & Precautions, NPO status , Patient's Chart, lab work & pertinent test results  History of Anesthesia Complications Negative for: history of anesthetic complications  Airway Mallampati: III   Neck ROM: Full    Dental no notable dental hx.    Pulmonary sleep apnea    Pulmonary exam normal breath sounds clear to auscultation       Cardiovascular Exercise Tolerance: Good negative cardio ROS Normal cardiovascular exam Rhythm:Regular Rate:Normal     Neuro/Psych negative neurological ROS     GI/Hepatic negative GI ROS,,,  Endo/Other  negative endocrine ROS    Renal/GU Renal disease (nephrolithiasis)     Musculoskeletal   Abdominal   Peds  Hematology negative hematology ROS (+)   Anesthesia Other Findings   Reproductive/Obstetrics                             Anesthesia Physical Anesthesia Plan  ASA: 2  Anesthesia Plan: General   Post-op Pain Management:    Induction: Intravenous  PONV Risk Score and Plan: 2 and Propofol infusion, TIVA and Treatment may vary due to age or medical condition  Airway Management Planned: Natural Airway  Additional Equipment:   Intra-op Plan:   Post-operative Plan:   Informed Consent: I have reviewed the patients History and Physical, chart, labs and discussed the procedure including the risks, benefits and alternatives for the proposed anesthesia with the patient or authorized representative who has indicated his/her understanding and acceptance.       Plan Discussed with: CRNA  Anesthesia Plan Comments: (LMA/GETA backup discussed.  Patient consented for risks of anesthesia including but not limited to:  - adverse reactions to medications - damage to eyes, teeth, lips or other oral mucosa - nerve damage due to positioning  - sore throat  or hoarseness - damage to heart, brain, nerves, lungs, other parts of body or loss of life  Informed patient about role of CRNA in peri- and intra-operative care.  Patient voiced understanding.)        Anesthesia Quick Evaluation

## 2022-10-30 NOTE — Anesthesia Procedure Notes (Signed)
Date/Time: 10/30/2022 7:42 AM  Performed by: Ginger Carne, CRNAPre-anesthesia Checklist: Patient identified, Emergency Drugs available, Suction available, Patient being monitored and Timeout performed Patient Re-evaluated:Patient Re-evaluated prior to induction Preoxygenation: Pre-oxygenation with 100% oxygen Induction Type: IV induction

## 2022-10-30 NOTE — Anesthesia Postprocedure Evaluation (Signed)
Anesthesia Post Note  Patient: Peter Price  Procedure(s) Performed: COLONOSCOPY WITH PROPOFOL  Patient location during evaluation: PACU Anesthesia Type: General Level of consciousness: awake and alert, oriented and patient cooperative Pain management: pain level controlled Vital Signs Assessment: post-procedure vital signs reviewed and stable Respiratory status: spontaneous breathing, nonlabored ventilation and respiratory function stable Cardiovascular status: blood pressure returned to baseline and stable Postop Assessment: adequate PO intake Anesthetic complications: no   No notable events documented.   Last Vitals:  Vitals:   10/30/22 0805 10/30/22 0835  BP: 103/77 117/84  Pulse: 77   Resp: 19   Temp: (!) 35.8 C   SpO2: 98%     Last Pain:  Vitals:   10/30/22 0835  TempSrc:   PainSc: 0-No pain                 Reed Breech

## 2022-10-30 NOTE — Op Note (Signed)
[facility] Patient Name: Peter Price Procedure Date: 10/30/2022 7:17 AM MRN: [id number] Account #: [id number] Date of Birth: 04/30/1971 Admit Type: Outpatient Age: 52 Room: [facility] ENDO ROOM 4 Gender: Male Note Status: Finalized Instrument Name: Prentice Docker [id number] Procedure:             Colonoscopy Indications:           Screening for colorectal malignant neoplasm Providers:             Wyline Mood MD, MD Referring MD:          No Local Md, MD (Referring MD) Medicines:             Monitored Anesthesia Care Complications:         No immediate complications. Procedure:             Pre-Anesthesia Assessment:                        - Prior to the procedure, a History and Physical was                         performed, and patient medications, allergies and                         sensitivities were reviewed. The patient's tolerance                         of previous anesthesia was reviewed.                        - The risks and benefits of the procedure and the                         sedation options and risks were discussed with the                         patient. All questions were answered and informed                         consent was obtained.                        - ASA Grade Assessment: II - A patient with mild                         systemic disease.                        After obtaining informed consent, the colonoscope was                         passed under direct vision. Throughout the procedure,                         the patient's blood pressure, pulse, and oxygen                         saturations were monitored continuously. The                         Colonoscope was  introduced through the anus and                         advanced to the the cecum, identified by the                         appendiceal orifice. The colonoscopy was performed                         with ease. The patient tolerated the procedure well.                          The quality of the bowel preparation was excellent.                         The ileocecal valve, appendiceal orifice, and rectum                         were photographed. Findings:      The perianal and digital rectal examinations were normal.      Non-bleeding internal hemorrhoids were found during retroflexion. The       hemorrhoids were medium-sized and Grade I (internal hemorrhoids that do       not prolapse).      A 3 mm polyp was found in the ascending colon. The polyp was sessile.       The polyp was removed with a jumbo cold forceps. Resection and retrieval       were complete.      The exam was otherwise without abnormality on direct and retroflexion       views. Impression:            - Non-bleeding internal hemorrhoids.                        - One 3 mm polyp in the ascending colon, removed with                         a jumbo cold forceps. Resected and retrieved.                        - The examination was otherwise normal on direct and                         retroflexion views. Recommendation:        - Discharge patient to home (with escort).                        - Resume previous diet.                        - Continue present medications.                        - Await pathology results.                        - Repeat colonoscopy for surveillance based on                         pathology results. Procedure Code(s):     ---  Professional ---                        [id number], Colonoscopy, flexible; with biopsy, single or                         multiple Diagnosis Code(s):     --- Professional ---                        D12.2, Benign neoplasm of ascending colon                        Z12.11, Encounter for screening for malignant neoplasm                         of colon                        K64.0, First degree hemorrhoids CPT copyright 2022 American Medical Association. All rights reserved. The codes documented in this report are preliminary and upon  coder review may  be revised to meet current compliance requirements. Wyline Mood, MD Wyline Mood MD, MD 10/30/2022 8:03:10 AM This report has been signed electronically. Number of Addenda: 0 Note Initiated On: 10/30/2022 7:17 AM Scope Withdrawal Time: 0 hours 12 minutes 32 seconds  Total Procedure Duration: 0 hours 14 minutes 43 seconds  Estimated Blood Loss:  Estimated blood loss: none.      [place]

## 2022-10-30 NOTE — Transfer of Care (Signed)
Immediate Anesthesia Transfer of Care Note  Patient: Peter Price  Procedure(s) Performed: COLONOSCOPY WITH PROPOFOL  Patient Location: Endoscopy Unit  Anesthesia Type:General  Level of Consciousness: sedated and responds to stimulation  Airway & Oxygen Therapy: Patient Spontanous Breathing  Post-op Assessment: Report given to RN and Post -op Vital signs reviewed and stable  Post vital signs: Reviewed and stable  Last Vitals:  Vitals Value Taken Time  BP 103/77 10/30/22 0806  Temp 35.8 C 10/30/22 0805  Pulse 76 10/30/22 0806  Resp 19 10/30/22 0806  SpO2 97 % 10/30/22 0806  Vitals shown include unvalidated device data.  Last Pain:  Vitals:   10/30/22 0805  TempSrc: Temporal  PainSc: Asleep         Complications: No notable events documented.

## 2022-11-02 ENCOUNTER — Encounter: Payer: Self-pay | Admitting: Gastroenterology

## 2022-11-02 LAB — SURGICAL PATHOLOGY

## 2024-05-02 ENCOUNTER — Other Ambulatory Visit: Payer: Self-pay

## 2024-05-02 MED ORDER — FLUZONE 0.5 ML IM SUSY
0.5000 mL | PREFILLED_SYRINGE | Freq: Once | INTRAMUSCULAR | 0 refills | Status: AC
  Filled 2024-05-02: qty 0.5, 1d supply, fill #0
